# Patient Record
Sex: Female | Born: 2000 | Race: Black or African American | Hispanic: No | Marital: Single | Smoking: Never smoker
Health system: Southern US, Community
[De-identification: ages and names within clinical notes are randomized; demographics above are authoritative.]

---

## 2019-10-17 ENCOUNTER — Emergency Department
Admission: EM | Admit: 2019-10-17 | Discharge: 2019-10-17 | Disposition: A | Discharge status: Home / Self Care | Payer: PRIVATE HEALTH INSURANCE | Attending: Emergency Medicine | Admitting: Emergency Medicine

## 2019-10-17 ENCOUNTER — Encounter: Payer: Self-pay | Admitting: Emergency Medicine

## 2019-10-17 ENCOUNTER — Other Ambulatory Visit: Payer: Self-pay

## 2019-10-17 ENCOUNTER — Emergency Department: Payer: PRIVATE HEALTH INSURANCE

## 2019-10-17 DIAGNOSIS — R1011 Right upper quadrant pain: Secondary | ICD-10-CM | POA: Insufficient documentation

## 2019-10-17 DIAGNOSIS — R1010 Upper abdominal pain, unspecified: Secondary | ICD-10-CM | POA: Diagnosis present

## 2019-10-17 LAB — COMPREHENSIVE METABOLIC PANEL
ALT: 16 U/L (ref 0–44)
AST: 16 U/L (ref 15–41)
Albumin: 3.8 g/dL (ref 3.5–5.0)
Alkaline Phosphatase: 65 U/L (ref 38–126)
Anion gap: 8 (ref 5–15)
BUN: 10 mg/dL (ref 6–20)
CO2: 26 mmol/L (ref 22–32)
Calcium: 8.8 mg/dL — ABNORMAL LOW (ref 8.9–10.3)
Chloride: 106 mmol/L (ref 98–111)
Creatinine, Ser: 0.66 mg/dL (ref 0.44–1.00)
GFR calc Af Amer: >60 mL/min (ref >60)
GFR calc non Af Amer: >60 mL/min (ref >60)
Glucose, Bld: 109 mg/dL — ABNORMAL HIGH (ref 70–99)
Potassium: 3.4 mmol/L — ABNORMAL LOW (ref 3.5–5.1)
Sodium: 140 mmol/L (ref 135–145)
Total Bilirubin: 0.5 mg/dL (ref 0.3–1.2)
Total Protein: 7.5 g/dL (ref 6.5–8.1)

## 2019-10-17 LAB — CBC WITH DIFFERENTIAL/PLATELET
Abs Immature Granulocytes: 0.04 10*3/uL (ref 0.00–0.07)
Basophils Absolute: 0 10*3/uL (ref 0.0–0.1)
Basophils Relative: 0 %
Eosinophils Absolute: 0.1 10*3/uL (ref 0.0–0.5)
Eosinophils Relative: 1 %
HCT: 39.8 % (ref 36.0–46.0)
Hemoglobin: 12.4 g/dL (ref 12.0–15.0)
Immature Granulocytes: 0 %
Lymphocytes Relative: 16 %
Lymphs Abs: 1.5 10*3/uL (ref 0.7–4.0)
MCH: 28.3 pg (ref 26.0–34.0)
MCHC: 31.2 g/dL (ref 30.0–36.0)
MCV: 90.9 fL (ref 80.0–100.0)
Monocytes Absolute: 0.5 10*3/uL (ref 0.1–1.0)
Monocytes Relative: 5 %
Neutro Abs: 7.4 10*3/uL (ref 1.7–7.7)
Neutrophils Relative %: 78 %
Platelets: 333 10*3/uL (ref 150–400)
RBC: 4.38 MIL/uL (ref 3.87–5.11)
RDW: 13 % (ref 11.5–15.5)
WBC: 9.6 10*3/uL (ref 4.0–10.5)
nRBC: 0 % (ref 0.0–0.2)

## 2019-10-17 LAB — URINALYSIS, COMPLETE (UACMP) WITH MICROSCOPIC
Bilirubin Urine: NEGATIVE
Glucose, UA: NEGATIVE mg/dL
Ketones, ur: NEGATIVE mg/dL
Leukocytes,Ua: NEGATIVE
Nitrite: NEGATIVE
Protein, ur: NEGATIVE mg/dL
Specific Gravity, Urine: 1.005 (ref 1.005–1.030)
pH: 6 (ref 5.0–8.0)

## 2019-10-17 LAB — LIPASE, BLOOD: Lipase: 20 U/L (ref 11–51)

## 2019-10-17 LAB — POCT PREGNANCY, URINE: Preg Test, Ur: NEGATIVE

## 2019-10-17 MED ORDER — FAMOTIDINE IN NACL 20-0.9 MG/50ML-% IV SOLN
20.0000 mg | Freq: Once | INTRAVENOUS | Status: AC
  Administered 2019-10-17: 20 mg via INTRAVENOUS
  Filled 2019-10-17: qty 50

## 2019-10-17 MED ORDER — ONDANSETRON HCL 4 MG/2ML IJ SOLN
4.0000 mg | Freq: Once | INTRAMUSCULAR | Status: AC
  Administered 2019-10-17: 4 mg via INTRAVENOUS
  Filled 2019-10-17: qty 2

## 2019-10-17 MED ORDER — SODIUM CHLORIDE 0.9 % IV BOLUS
1000.0000 mL | Freq: Once | INTRAVENOUS | Status: AC
  Administered 2019-10-17: 1000 mL via INTRAVENOUS

## 2019-10-17 MED ORDER — FAMOTIDINE 20 MG PO TABS: 20.0000 mg | ORAL_TABLET | Freq: Every day | ORAL | 0 refills | Status: DC

## 2019-10-17 MED ORDER — KETOROLAC TROMETHAMINE 30 MG/ML IJ SOLN
15.0000 mg | Freq: Once | INTRAMUSCULAR | Status: AC
  Administered 2019-10-17: 15 mg via INTRAVENOUS
  Filled 2019-10-17: qty 1

## 2019-10-17 MED ORDER — FENTANYL CITRATE (PF) 100 MCG/2ML IJ SOLN
25.0000 ug | Freq: Once | INTRAMUSCULAR | Status: AC
  Administered 2019-10-17: 25 ug via INTRAVENOUS
  Filled 2019-10-17: qty 2

## 2019-10-17 MED ORDER — NAPROXEN 375 MG PO TABS: 375.0000 mg | ORAL_TABLET | Freq: Two times a day (BID) | ORAL | 0 refills | Status: AC

## 2019-10-17 NOTE — ED Provider Notes (Signed)
[facility] Emergency Department Provider Note   ____________________________________________   First MD Initiated Contact with Patient 10/17/19 [phone]     (approximate)  I have reviewed the triage vital signs and the nursing notes.   HISTORY  Chief Complaint Abdominal Pain    HPI Taylor Merritt is a 18 y.o. female who presents to the ED with a chief complaint of abdominal pain.  Patient awoke approximately 1 hour prior to arrival with upper abdominal pain associated with nausea and vomiting.  Ate chicken and rice from the dining hall last evening.  Denies fever, cough, chest pain, shortness of breath, dysuria, diarrhea.       Past medical history None  There are no active problems to display for this patient.   History reviewed. No pertinent surgical history.  Prior to Admission medications   Not on File    Allergies Patient has no known allergies.  No family history on file.  Social History Social History   Tobacco Use  . Smoking status: Not on file  Substance Use Topics  . Alcohol use: Not on file  . Drug use: Not on file    Review of Systems  Constitutional: No fever/chills Eyes: No visual changes. ENT: No sore throat. Cardiovascular: Denies chest pain. Respiratory: Denies shortness of breath. Gastrointestinal: Positive for abdominal pain, nausea and vomiting.  No diarrhea.  No constipation. Genitourinary: Negative for dysuria. Musculoskeletal: Negative for back pain. Skin: Negative for rash. Neurological: Negative for headaches, focal weakness or numbness.   ____________________________________________   PHYSICAL EXAM:  VITAL SIGNS: ED Triage Vitals  Enc Vitals Group     BP 10/17/19 0513 135/76     Pulse Rate 10/17/19 0513 88     Resp 10/17/19 0513 20     Temp 10/17/19 0513 98.3 F (36.8 C)     Temp Source 10/17/19 0513 Oral     SpO2 10/17/19 0513 100 %     Weight 10/17/19 0513 300 lb (136.1 kg)     Height  10/17/19 0513 5\' 6"  (1.676 m)     Head Circumference --      Peak Flow --      Pain Score 10/17/19 0514 8     Pain Loc --      Pain Edu? --      Excl. in [place]? --     Constitutional: Alert and oriented. Well appearing and in mild acute distress. Eyes: Conjunctivae are normal. PERRL. EOMI. Head: Atraumatic. Nose: No congestion/rhinnorhea. Mouth/Throat: Mucous membranes are moist.  Oropharynx non-erythematous. Neck: No stridor.   Cardiovascular: Normal rate, regular rhythm. Grossly normal heart sounds.  Good peripheral circulation. Respiratory: Normal respiratory effort.  No retractions. Lungs CTAB. Gastrointestinal: Obese.  Soft and mildly tender to palpation epigastrium and right upper quadrant without rebound or guarding. No distention. No abdominal bruits. No CVA tenderness. Musculoskeletal: No lower extremity tenderness nor edema.  No joint effusions. Neurologic:  Normal speech and language. No gross focal neurologic deficits are appreciated. No gait instability. Skin:  Skin is warm, dry and intact. No rash noted. Psychiatric: Mood and affect are normal. Speech and behavior are normal.  ____________________________________________   LABS (all labs ordered are listed, but only abnormal results are displayed)  Labs Reviewed  COMPREHENSIVE METABOLIC PANEL - Abnormal; Notable for the following components:      Result Value   Potassium 3.4 (*)    Glucose, Bld 109 (*)    Calcium 8.8 (*)    All other components  within normal limits  CBC WITH DIFFERENTIAL/PLATELET  LIPASE, BLOOD  URINALYSIS, COMPLETE (UACMP) WITH MICROSCOPIC   ____________________________________________  EKG  None ____________________________________________  RADIOLOGY  ED MD interpretation:  Pending  Official radiology report(s): No results found.  ____________________________________________   PROCEDURES  Procedure(s) performed (including Critical Care):  Procedures    ____________________________________________   INITIAL IMPRESSION / ASSESSMENT AND PLAN / ED COURSE  As part of my medical decision making, I reviewed the following data within the electronic MEDICAL RECORD NUMBER Nursing notes reviewed and incorporated, Labs reviewed, Old chart reviewed and Notes from prior ED visits     Taylor Merritt was evaluated in Emergency Department on 10/17/2019 for the symptoms described in the history of present illness. She was evaluated in the context of the global COVID-19 pandemic, which necessitated consideration that the patient might be at risk for infection with the SARS-CoV-2 virus that causes COVID-19. Institutional protocols and algorithms that pertain to the evaluation of patients at risk for COVID-19 are in a state of rapid change based on information released by regulatory bodies including the CDC and federal and state organizations. These policies and algorithms were followed during the patient's care in the ED.    18 year old female who presents with upper abdominal pain, nausea and vomiting. Differential diagnosis includes, but is not limited to, biliary disease (biliary colic, acute cholecystitis, cholangitis, choledocholithiasis, etc), intrathoracic causes for epigastric abdominal pain including ACS, gastritis, duodenitis, pancreatitis, small bowel or large bowel obstruction, abdominal aortic aneurysm, hernia, and ulcer(s).  Will obtain lab work to include LFTs and lipase, obtain right upper quadrant abdominal ultrasound.  Initiate IV fluid resuscitation.  Will administer low-dose IV fentanyl for pain, IV Pepcid and IV Zofran for nausea.  Clinical Course as of Oct 16 708  Fri Oct 17, 2019  7106 Patient awaiting ultrasound.  Care transferred to Dr. Erma Heritage at change of shift.   [JS]    Clinical Course User Index [JS] Irean Hong, MD     ____________________________________________   FINAL CLINICAL IMPRESSION(S) / ED DIAGNOSES  Final diagnoses:   Pain of upper abdomen     ED Discharge Orders    None       Note:  This document was prepared using Dragon voice recognition software and may include unintentional dictation errors.   Irean Hong, MD 10/17/19 [phone]

## 2019-10-17 NOTE — ED Notes (Signed)
Report given to Teresa, RN

## 2019-10-17 NOTE — ED Provider Notes (Signed)
Assumed care from Dr. Beather Arbour at 7 AM. Briefly, the patient is a 18 y.o. female with PMHx of  has no past medical history on file. here with abdominal pain, RUQ pain, n/v. Lab work reassuring.   Labs Reviewed  COMPREHENSIVE METABOLIC PANEL - Abnormal; Notable for the following components:      Result Value   Potassium 3.4 (*)    Glucose, Bld 109 (*)    Calcium 8.8 (*)    All other components within normal limits  CBC WITH DIFFERENTIAL/PLATELET  LIPASE, BLOOD  URINALYSIS, COMPLETE (UACMP) WITH MICROSCOPIC    Course of Care: -RUQ U/S pending. Labs reviewed and are unremarkable. U/S reviewed by myself - normal GB wall thickness, normal CBD, no apparent stones. Pt pain improved. Plan to trial antacids and d/c if U/S neg. -Pain improved. It seems reproducible on exam and worsens w/ movement and palpation - suspect possible MSK pain. Will tx with NSAIDs, give ppx GI meds as well. Pt is not hypoxic, tachycardic, not on OCPs, is PERC neg - doubt PE, no cough or SOB, doubt PNA or referred pulm source.        Duffy Bruce, MD 10/17/19 [phone]

## 2019-10-17 NOTE — ED Notes (Signed)
Ultrasound at bedside

## 2019-10-17 NOTE — ED Triage Notes (Signed)
Patient ambulatory to triage with steady gait, without difficulty or distress noted, mask in place; pt reports upper abd pain this am, nonradiating with no accomp symptoms; denies hx of same

## 2019-10-17 NOTE — Discharge Instructions (Addendum)
I suspect your pain could be due to a pull or strain of the muscles in your abdominal wall.   For this, I have prescribed an anti-inflammatory to help with pain for the next 5 days. Take this as prescribed, with food. Do not take advil, ibuprofen, or other pain medications with this.  Take the antacid as well to protect your stomach while on the medication. You can use over-the-counter Pepcid/Famotidine as an alternative.  Drink plenty of fluids. Stick to a bland, non-spicy diet.

## 2020-03-22 ENCOUNTER — Ambulatory Visit: Payer: PRIVATE HEALTH INSURANCE | Attending: Internal Medicine

## 2020-04-29 NOTE — Progress Notes (Deleted)
    System, Pcp Not In   No chief complaint on file.   HPI:      Ms. Taylor Merritt is a 19 y.o. No obstetric history on file. whose LMP was No LMP recorded., presents today for NP  Treated for BV with flagyl 1/21 afte televisit  No past medical history on file.  No past surgical history on file.  No family history on file.  Social History   Socioeconomic History  . Marital status: Single    Spouse name: Not on file  . Number of children: Not on file  . Years of education: Not on file  . Highest education level: Not on file  Occupational History  . Not on file  Tobacco Use  . Smoking status: Not on file  Substance and Sexual Activity  . Alcohol use: Not on file  . Drug use: Not on file  . Sexual activity: Not on file  Other Topics Concern  . Not on file  Social History Narrative  . Not on file   Social Determinants of Health   Financial Resource Strain:   . Difficulty of Paying Living Expenses:   Food Insecurity:   . Worried About Programme researcher, broadcasting/film/video in the Last Year:   . Barista in the Last Year:   Transportation Needs:   . Freight forwarder (Medical):   Marland Kitchen Lack of Transportation (Non-Medical):   Physical Activity:   . Days of Exercise per Week:   . Minutes of Exercise per Session:   Stress:   . Feeling of Stress :   Social Connections:   . Frequency of Communication with Friends and Family:   . Frequency of Social Gatherings with Friends and Family:   . Attends Religious Services:   . Active Member of Clubs or Organizations:   . Attends Banker Meetings:   Marland Kitchen Marital Status:   Intimate Partner Violence:   . Fear of Current or Ex-Partner:   . Emotionally Abused:   Marland Kitchen Physically Abused:   . Sexually Abused:     Outpatient Medications Prior to Visit  Medication Sig Dispense Refill  . famotidine (PEPCID) 20 MG tablet Take 1 tablet (20 mg total) by mouth daily for 5 days. 5 tablet 0   No facility-administered medications  prior to visit.      ROS:  Review of Systems BREAST: No symptoms   OBJECTIVE:   Vitals:  There were no vitals taken for this visit.  Physical Exam  Results: No results found for this or any previous visit (from the past 24 hour(s)).   Assessment/Plan: No diagnosis found.    No orders of the defined types were placed in this encounter.     No follow-ups on file.  Fawaz Borquez B. Hether Anselmo, PA-C 04/29/2020 8:07 PM

## 2020-04-30 ENCOUNTER — Encounter: Payer: PRIVATE HEALTH INSURANCE | Admitting: Obstetrics and Gynecology

## 2020-05-10 ENCOUNTER — Ambulatory Visit
Admission: EM | Admit: 2020-05-10 | Discharge: 2020-05-10 | Disposition: A | Discharge status: Home / Self Care | Payer: PRIVATE HEALTH INSURANCE | Attending: Emergency Medicine | Admitting: Emergency Medicine

## 2020-05-10 ENCOUNTER — Encounter: Payer: Self-pay | Admitting: Emergency Medicine

## 2020-05-10 ENCOUNTER — Other Ambulatory Visit: Payer: Self-pay

## 2020-05-10 DIAGNOSIS — Z202 Contact with and (suspected) exposure to infections with a predominantly sexual mode of transmission: Secondary | ICD-10-CM | POA: Diagnosis not present

## 2020-05-10 NOTE — ED Notes (Signed)
Patient at [facility] no complaints would like to be checked for STD. 

## 2020-05-10 NOTE — Discharge Instructions (Addendum)
Your STD tests are pending.  If the test results are positive, we will call you.  You may need treatment at that time.  Do not have sexual activity until the test results are back.

## 2020-05-10 NOTE — ED Triage Notes (Signed)
Patient at [facility] no complaints would like to be checked for STD.

## 2020-05-10 NOTE — ED Provider Notes (Signed)
Renaldo Fiddler    CSN: [id number] Arrival date & time: 05/10/20  1002      History   Chief Complaint Chief Complaint  Patient presents with  . Exposure to STD    HPI Taylor Merritt is a 19 y.o. female.   Patient presents with request for STD testing.  She is asymptomatic but is sexually active without protection.  No known STD contacts.  Denies rash, lesions, abdominal pain, dysuria, vaginal discharge, pelvic pain, or other symptoms.  She declines HIV/RPR.    The history is provided by the patient.    History reviewed. No pertinent past medical history.  There are no problems to display for this patient.   History reviewed. No pertinent surgical history.  OB History   No obstetric history on file.      Home Medications    Prior to Admission medications   Medication Sig Start Date End Date Taking? Authorizing Provider  famotidine (PEPCID) 20 MG tablet Take 1 tablet (20 mg total) by mouth daily for 5 days. 10/17/19 10/22/19  Shaune Pollack, MD    Family History Family History  Problem Relation Age of Onset  . Healthy Mother   . Healthy Father     Social History Social History   Tobacco Use  . Smoking status: Never Smoker  . Smokeless tobacco: Never Used  Substance Use Topics  . Alcohol use: Yes  . Drug use: Never     Allergies   Patient has no known allergies.   Review of Systems Review of Systems  Constitutional: Negative for chills and fever.  HENT: Negative for ear pain and sore throat.   Eyes: Negative for pain and visual disturbance.  Respiratory: Negative for cough and shortness of breath.   Cardiovascular: Negative for chest pain and palpitations.  Gastrointestinal: Negative for abdominal pain and vomiting.  Genitourinary: Negative for dysuria, flank pain, hematuria, pelvic pain and vaginal discharge.  Musculoskeletal: Negative for arthralgias and back pain.  Skin: Negative for color change and rash.  Neurological: Negative for  seizures and syncope.  All other systems reviewed and are negative.    Physical Exam Triage Vital Signs ED Triage Vitals  Enc Vitals Group     BP 05/10/20 1009 122/79     Pulse Rate 05/10/20 1009 64     Resp 05/10/20 1009 18     Temp 05/10/20 1009 97.9 F (36.6 C)     Temp Source 05/10/20 1009 Oral     SpO2 05/10/20 1009 98 %     Weight 05/10/20 1004 295 lb (133.8 kg)     Height --      Head Circumference --      Peak Flow --      Pain Score 05/10/20 1004 0     Pain Loc --      Pain Edu? --      Excl. in [place]? --    No data found.  Updated Vital Signs BP 122/79 (BP Location: Left Arm)   Pulse 64   Temp 97.9 F (36.6 C) (Oral)   Resp 18   Wt 295 lb (133.8 kg)   LMP 04/19/2020   SpO2 98%   BMI 47.61 kg/m   Visual Acuity Right Eye Distance:   Left Eye Distance:   Bilateral Distance:    Right Eye Near:   Left Eye Near:    Bilateral Near:     Physical Exam Vitals and nursing note reviewed.  Constitutional:  General: She is not in acute distress.    Appearance: She is well-developed.  HENT:     Head: Normocephalic and atraumatic.     Mouth/Throat:     Mouth: Mucous membranes are moist.     Pharynx: Oropharynx is clear.  Eyes:     Conjunctiva/sclera: Conjunctivae normal.  Cardiovascular:     Rate and Rhythm: Normal rate and regular rhythm.     Heart sounds: No murmur.  Pulmonary:     Effort: Pulmonary effort is normal. No respiratory distress.     Breath sounds: Normal breath sounds.  Abdominal:     Palpations: Abdomen is soft.     Tenderness: There is no abdominal tenderness. There is no guarding or rebound.  Musculoskeletal:     Cervical back: Neck supple.  Skin:    General: Skin is warm and dry.     Findings: No rash.  Neurological:     General: No focal deficit present.     Mental Status: She is alert and oriented to person, place, and time.  Psychiatric:        Mood and Affect: Mood normal.        Behavior: Behavior normal.      UC  Treatments / Results  Labs (all labs ordered are listed, but only abnormal results are displayed) Labs Reviewed  CERVICOVAGINAL ANCILLARY ONLY    EKG   Radiology No results found.  Procedures Procedures (including critical care time)  Medications Ordered in UC Medications - No data to display  Initial Impression / Assessment and Plan / UC Course  I have reviewed the triage vital signs and the nursing notes.  Pertinent labs & imaging results that were available during my care of the patient were reviewed by me and considered in my medical decision making (see chart for details).   Possible exposure to STD.  Vaginal self swab obtained by patient.  Instructed her to abstain from sexual activity until the test results are back.  Discussed that we will call her if her results are positive requiring treatment.  Discussed that she can also monitor her results on her MyChart account.  Patient agrees to plan of care.     Final Clinical Impressions(s) / UC Diagnoses   Final diagnoses:  Possible exposure to STD     Discharge Instructions     Your STD tests are pending.  If the test results are positive, we will call you.  You may need treatment at that time.  Do not have sexual activity until the test results are back.        ED Prescriptions    None     PDMP not reviewed this encounter.   Sharion Balloon, NP 05/10/20 1032

## 2020-05-11 LAB — CERVICOVAGINAL ANCILLARY ONLY
Bacterial Vaginitis (gardnerella): NEGATIVE
Candida Glabrata: NEGATIVE
Candida Vaginitis: NEGATIVE
Chlamydia: NEGATIVE
Comment: NEGATIVE
Comment: NEGATIVE
Comment: NEGATIVE
Comment: NEGATIVE
Comment: NEGATIVE
Comment: NORMAL
Neisseria Gonorrhea: NEGATIVE
Trichomonas: NEGATIVE

## 2021-03-22 IMAGING — US US ABDOMEN LIMITED
1 series · 14 of 25 positions shown · non-contrast
Comparison: None.

CLINICAL DATA: Right upper quadrant abdominal pain, nausea and
vomiting for 2 hours.

EXAM:
ULTRASOUND ABDOMEN LIMITED RIGHT UPPER QUADRANT

[Series 1: us abdomen limited · 14 of 35 slices shown]
[im 1/35]
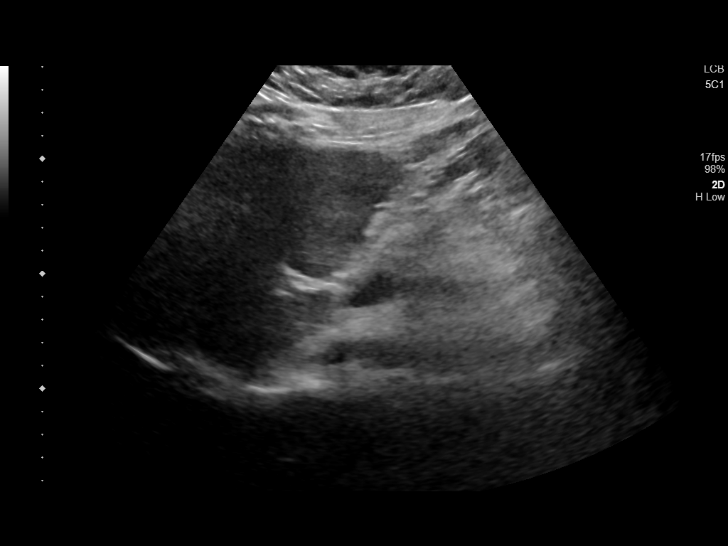
[im 3/35]
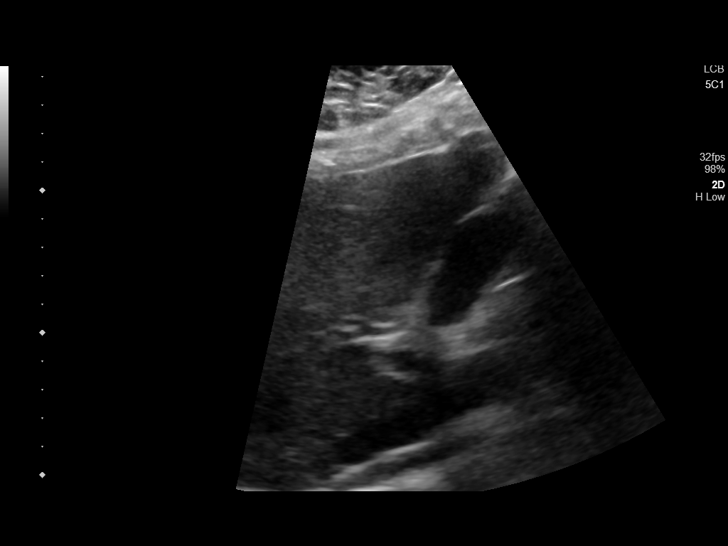
[im 6/35]
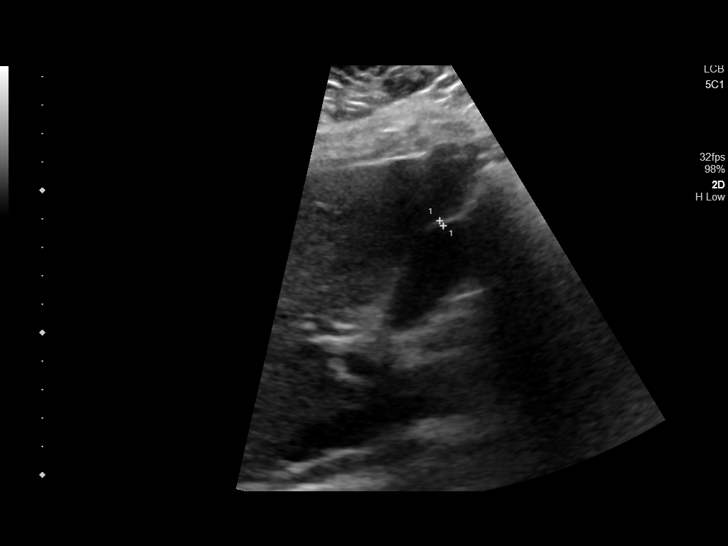
[im 9/35]
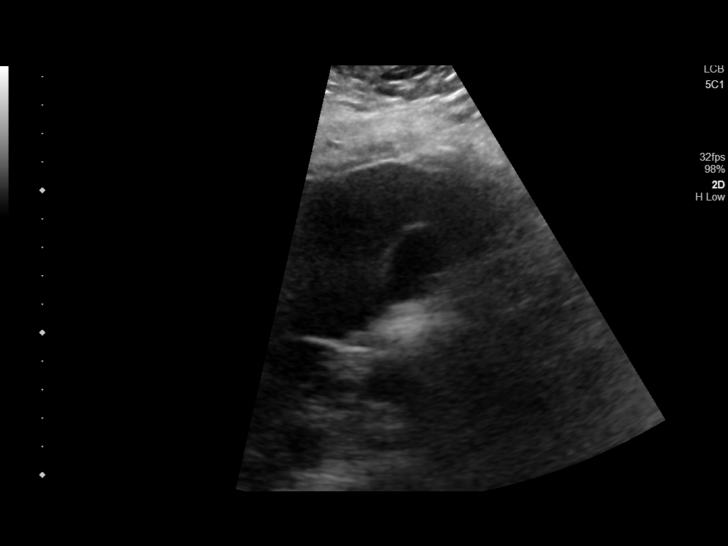
[im 12/35]
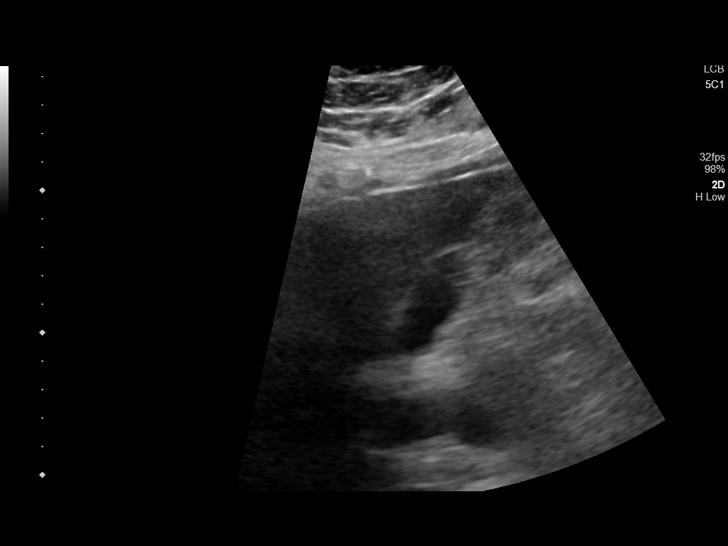
[im 13/35]
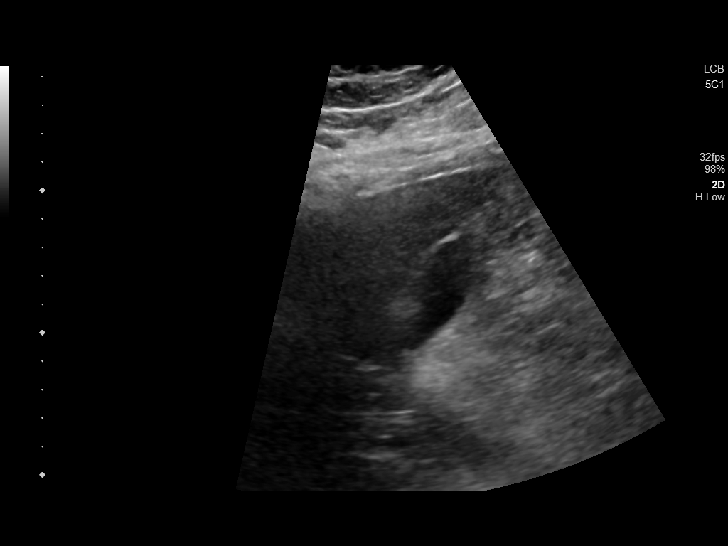
[im 16/35]
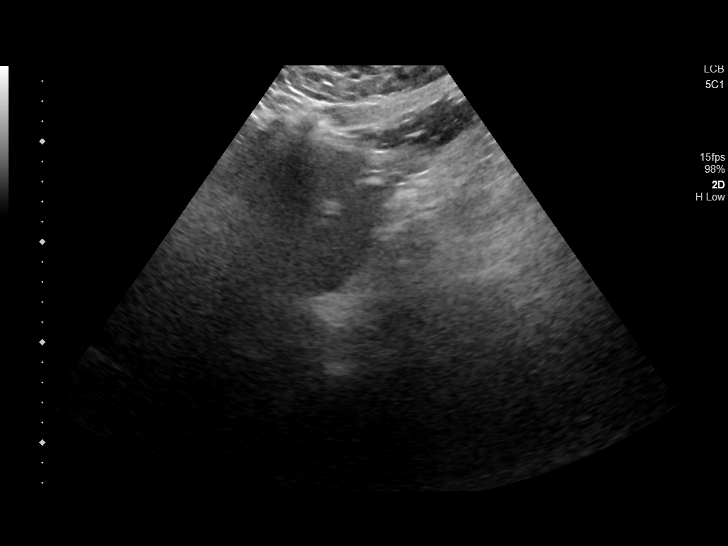
[im 19/35]
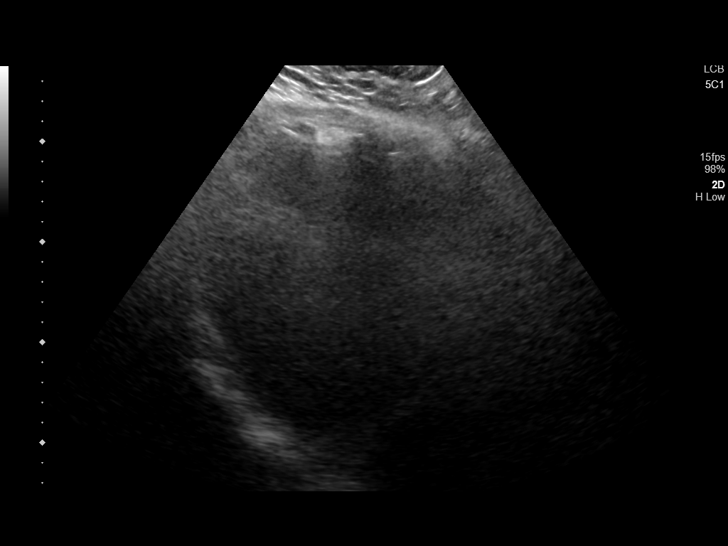
[im 22/35]
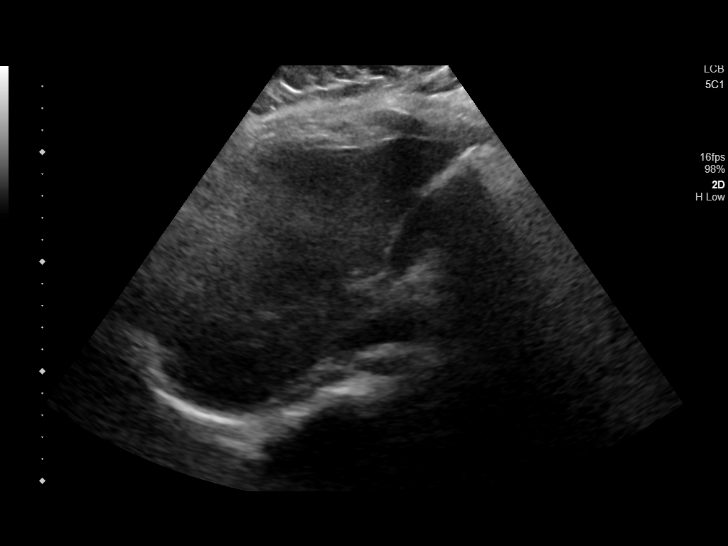
[im 23/35]
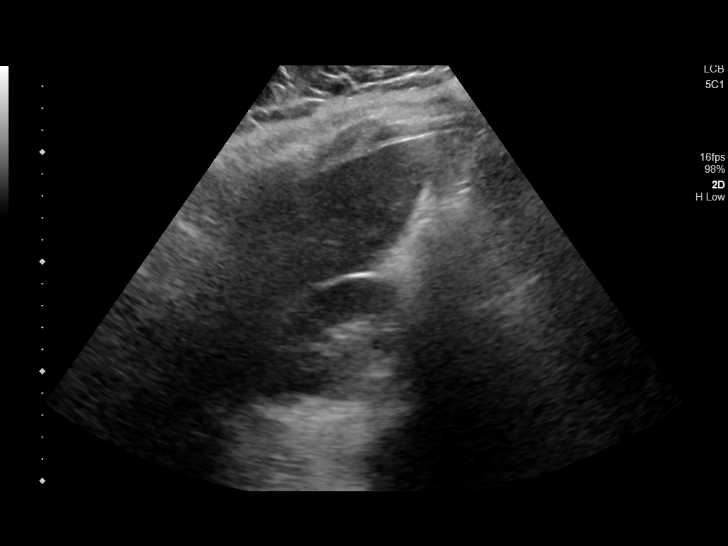
[im 26/35]
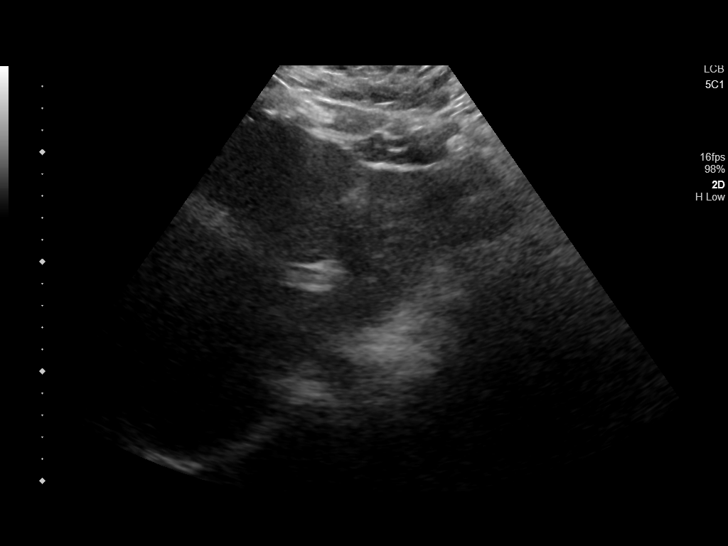
[im 29/35]
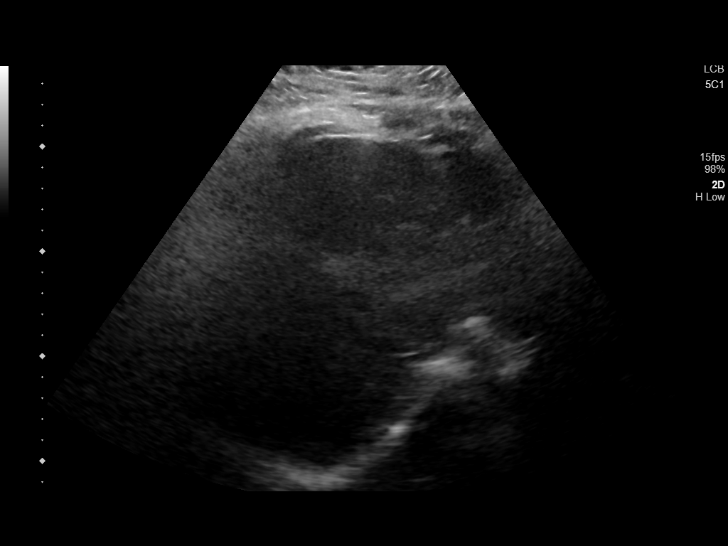
[im 32/35]
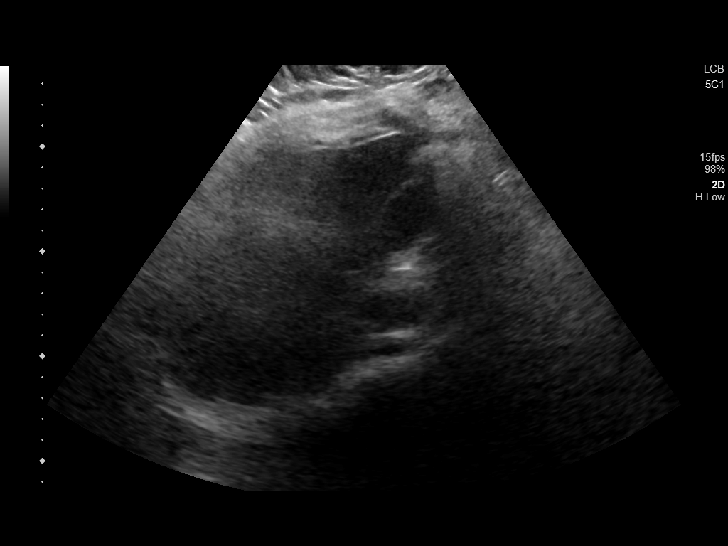
[im 35/35]
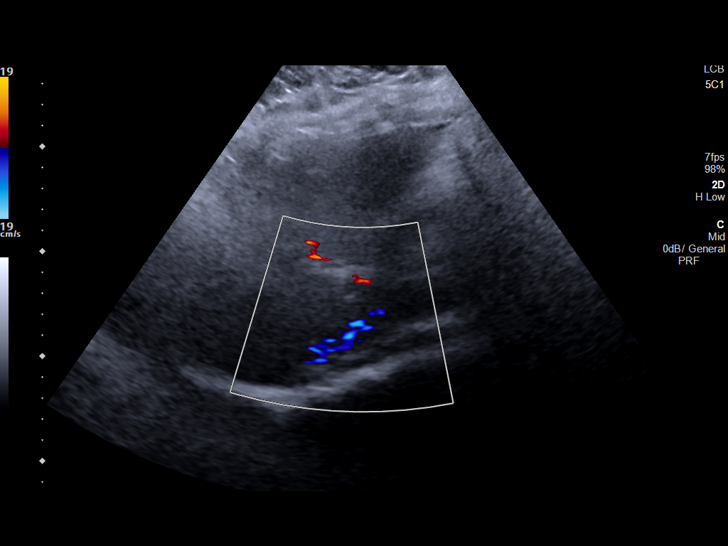

[14 of 25 positions shown; findings below may reference images not displayed]

FINDINGS: Gallbladder:

No gallstones or wall thickening visualized. No sonographic Murphy
sign noted by sonographer.

Common bile duct:

Diameter: 3.0 mm

Liver:

Normal echogenicity without focal lesion or biliary dilatation.
Portal vein is patent on color Doppler imaging with normal direction
of blood flow towards the liver.

Other: None.
IMPRESSION: Unremarkable right upper quadrant ultrasound examination.

## 2022-09-20 ENCOUNTER — Ambulatory Visit (INDEPENDENT_AMBULATORY_CARE_PROVIDER_SITE_OTHER): Payer: PRIVATE HEALTH INSURANCE | Admitting: Medical

## 2022-09-20 ENCOUNTER — Encounter: Payer: Self-pay | Admitting: Medical

## 2022-09-20 ENCOUNTER — Other Ambulatory Visit: Payer: Self-pay

## 2022-09-20 VITALS — BP 127/68 | HR 79 | Temp 99.4°F | Ht 66.24 in | Wt 318.0 lb

## 2022-09-20 DIAGNOSIS — Z3202 Encounter for pregnancy test, result negative: Secondary | ICD-10-CM

## 2022-09-20 DIAGNOSIS — Z30012 Encounter for prescription of emergency contraception: Secondary | ICD-10-CM

## 2022-09-20 DIAGNOSIS — Z3009 Encounter for other general counseling and advice on contraception: Secondary | ICD-10-CM | POA: Diagnosis not present

## 2022-09-20 LAB — POCT URINE PREGNANCY: Preg Test, Ur: NEGATIVE

## 2022-09-20 MED ORDER — ULIPRISTAL ACETATE 30 MG PO TABS: 1.0000 | ORAL_TABLET | Freq: Once | ORAL | 0 refills | Status: AC

## 2022-09-20 NOTE — Progress Notes (Unsigned)
[place] [place], [place] [address] Phone: [phone] Fax: [phone]   Office Visit Note  Patient Name: Taylor Merritt  Date of Birth:27-Aug-2001  Med Rec number [id number]  Date of Service: 09/23/2022  Patient has no known allergies.  Chief Complaint  Patient presents with   Possible Pregnancy    Would like pregnancy test     HPI 21 YO female presents requesting pregnancy test. Estimates LMP 07/24/22. Patient states menses are sometimes irregular, has months when she doesn't have menses. Patient states she had vaginal intercourse with female partner for 1st time 2d ago, did not use condom. Has not taken emergency contraceptive, would be interested in this.   Patient states she was on OCP in high school for about 6 months, does not recall name. Had mood issues and weight gain on pills and wishes to avoid taking OCP again. She is interested in IUD.  Review of records in EHR/Care Everywhere shows documentation of prior sexual partners/activity. OCP prescribed in 2020 was Norethindrone/EE/Fe 1/20. Had pap smear and STI testing June 2023, all negative.   Current Medication: None   Medical History: No pertinent past medical history.   Vital Signs: BP 127/68   Pulse 79   Temp 99.4 F (37.4 C) (Tympanic)   Ht 5' 6.24" (1.682 m)   Wt (!) 318 lb (144.2 kg)   SpO2 99%   BMI 50.96 kg/m    Review of Systems  Genitourinary:  Positive for menstrual problem (delayed menses/amenorrhea).    Physical Exam Vitals reviewed.  Constitutional:      General: She is not in acute distress.    Appearance: She is obese. She is not ill-appearing.  Neurological:     Mental Status: She is alert.    Recent Results (from the past 2160 hour(s))  POCT urine pregnancy     Status: Normal   Collection Time: 09/20/22 10:25 AM  Result Value Ref Range   Preg Test, Ur Negative Negative    Assessment/Plan: 1. Pregnancy examination or test, negative result Discussed  with patient unlikelihood of positive pregnancy test less than 48 hours after unprotected sex. Patient still wishes to proceed with test today, reasonable given LMP > 8 weeks ago. - POCT urine pregnancy; Result: NEGATIVE  2. Emergency contraceptive counseling and prescription Given it has been less than 48 hours since unprotected sex, patient is good candidate for emergency contraception. Advised that Festus Holts is most effective option. Levonorgestrel (Plan B) less effective in women who are overweight. Patient agrees to take [place]. Dispensed Festus Holts from [employer]. Patient states she will return before end of clinic day to pick up/pay for med since she does not have form of payment with her at today's visit.  Pt advised to take pill as soon as possible for best effectiveness and within 5 days of unprotected sex.  Pt advised to repeat at-home pregnancy test if she does not have menses in next 2 weeks.  - ulipristal acetate (ELLA) 30 MG tablet; Take 1 tablet (30 mg total) by mouth once for 1 dose. As soon as possible after and within 5 days of unprotected sex.  Dispense: 1 tablet; Refill: 0   3. General counseling and advice for contraceptive management Recommend starting form of long term birth control if she intends to continue having sex. Patient denies plans to have sex in near future but does wish to consider getting IUD. Offered referral to local GYN, patient prefers to wait until she is  home over upcoming school break to see GYN there. Patient strongly advised to use condoms consistently if she has sex again in meantime.  Encouraged to schedule appointment for STI screening in approximately 2 weeks.  Patient did not return to clinic before end of clinic day to pick up medication Festus Holts). Had CMA call - patient was reached by phone and reminded she should pick up and take medication as soon as possible for best effectiveness for prevention of pregnancy. Patient advised of clinic  hours for following day (09/21/22) to pick up prescription. Patient verbalized understanding to [place].    General Counseling: vara osterkamp understanding of the findings of todays visit and agrees with plan of treatment. I have discussed any further diagnostic evaluation that may be needed or ordered today. We also reviewed her medications today. she has been encouraged to call the office with any questions or concerns that should arise related to todays visit.   Orders Placed This Encounter  Procedures   POCT urine pregnancy    Meds ordered this encounter  Medications   ulipristal acetate (ELLA) 30 MG tablet    Sig: Take 1 tablet (30 mg total) by mouth once for 1 dose. As soon as possible after and within 5 days of unprotected sex.    Dispense:  1 tablet    Refill:  0    Order Specific Question:   Supervising Provider    Answer:   Dutch Gray [id number]    Time spent: Bar[place] PA-C [place] 09/23/2022 3:10 PM

## 2022-09-23 ENCOUNTER — Encounter: Payer: Self-pay | Admitting: Medical

## 2022-09-23 NOTE — Patient Instructions (Signed)
-  Pick up emergency contraceptive pill Festus Holts) and take pill as soon as possible and within 5 days of unprotected sex. -Take an at-home pregnancy test or schedule another appointment for a pregnancy test in clinic if you have not gotten your period in the next 2 weeks. -Consider scheduling an appointment for STI screening in about 2 weeks.  -Schedule an appointment with your gynecologist over Fall or Thanksgiving break to get an IUD. If you would like a referral to a local gynecologist to get an IUD sooner, let me know (call or send MyChart message).  -Make sure to always use condoms when you have sex to prevent STIs and to prevent pregnancy. This is especially important now and any other time when you are not using another form of birth control.
